# Patient Record
Sex: Male | Born: 1980 | Race: White | Hispanic: No | Marital: Single | Smoking: Never smoker
Health system: Southern US, Community
[De-identification: ages and names within clinical notes are randomized; demographics above are authoritative.]

---
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
## 2019-04-07 ENCOUNTER — Emergency Department

## 2019-04-07 ENCOUNTER — Emergency Department
Admission: EM | Admit: 2019-04-07 | Discharge: 2019-04-07 | Disposition: A | Discharge status: Home / Self Care | Attending: Emergency Medicine | Admitting: Emergency Medicine

## 2019-04-07 ENCOUNTER — Other Ambulatory Visit: Payer: Self-pay

## 2019-04-07 ENCOUNTER — Encounter: Payer: Self-pay | Admitting: Emergency Medicine

## 2019-04-07 DIAGNOSIS — M545 Low back pain: Secondary | ICD-10-CM | POA: Diagnosis not present

## 2019-04-07 DIAGNOSIS — R1033 Periumbilical pain: Secondary | ICD-10-CM | POA: Insufficient documentation

## 2019-04-07 DIAGNOSIS — S01112A Laceration without foreign body of left eyelid and periocular area, initial encounter: Secondary | ICD-10-CM | POA: Diagnosis not present

## 2019-04-07 DIAGNOSIS — Y929 Unspecified place or not applicable: Secondary | ICD-10-CM | POA: Diagnosis not present

## 2019-04-07 DIAGNOSIS — R42 Dizziness and giddiness: Secondary | ICD-10-CM

## 2019-04-07 DIAGNOSIS — S0181XA Laceration without foreign body of other part of head, initial encounter: Secondary | ICD-10-CM

## 2019-04-07 DIAGNOSIS — G44311 Acute post-traumatic headache, intractable: Secondary | ICD-10-CM | POA: Insufficient documentation

## 2019-04-07 DIAGNOSIS — R0789 Other chest pain: Secondary | ICD-10-CM | POA: Diagnosis not present

## 2019-04-07 DIAGNOSIS — Y999 Unspecified external cause status: Secondary | ICD-10-CM | POA: Diagnosis not present

## 2019-04-07 DIAGNOSIS — M25511 Pain in right shoulder: Secondary | ICD-10-CM | POA: Diagnosis not present

## 2019-04-07 DIAGNOSIS — M542 Cervicalgia: Secondary | ICD-10-CM | POA: Diagnosis not present

## 2019-04-07 DIAGNOSIS — W503XXA Accidental bite by another person, initial encounter: Secondary | ICD-10-CM

## 2019-04-07 DIAGNOSIS — Z23 Encounter for immunization: Secondary | ICD-10-CM | POA: Insufficient documentation

## 2019-04-07 DIAGNOSIS — S0993XA Unspecified injury of face, initial encounter: Secondary | ICD-10-CM | POA: Diagnosis present

## 2019-04-07 DIAGNOSIS — Y9389 Activity, other specified: Secondary | ICD-10-CM | POA: Diagnosis not present

## 2019-04-07 DIAGNOSIS — S51052A Open bite, left elbow, initial encounter: Secondary | ICD-10-CM | POA: Insufficient documentation

## 2019-04-07 MED ORDER — ACETAMINOPHEN 500 MG PO TABS: 1000.0000 mg | ORAL_TABLET | Freq: Three times a day (TID) | ORAL | 0 refills | Status: AC | PRN

## 2019-04-07 MED ORDER — BACITRACIN-NEOMYCIN-POLYMYXIN 400-5-5000 EX OINT
TOPICAL_OINTMENT | Freq: Once | CUTANEOUS | Status: AC
  Administered 2019-04-07: 1 via TOPICAL
  Filled 2019-04-07: qty 1

## 2019-04-07 MED ORDER — TRAMADOL HCL 50 MG PO TABS
50.0000 mg | ORAL_TABLET | Freq: Once | ORAL | Status: AC
  Administered 2019-04-07: 16:00:00 50 mg via ORAL
  Filled 2019-04-07: qty 1

## 2019-04-07 MED ORDER — LIDOCAINE HCL (PF) 1 % IJ SOLN
5.0000 mL | Freq: Once | INTRAMUSCULAR | Status: AC
  Administered 2019-04-07: 5 mL
  Filled 2019-04-07: qty 5

## 2019-04-07 MED ORDER — TETANUS-DIPHTH-ACELL PERTUSSIS 5-2.5-18.5 LF-MCG/0.5 IM SUSP
0.5000 mL | Freq: Once | INTRAMUSCULAR | Status: AC
  Administered 2019-04-07: 16:00:00 0.5 mL via INTRAMUSCULAR
  Filled 2019-04-07: qty 0.5
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
